# Patient Record
Sex: Female | Born: 1990 | Race: White | Hispanic: No | Marital: Single | Smoking: Never smoker
Health system: Southern US, Community
[De-identification: ages and names within clinical notes are randomized; demographics above are authoritative.]

## PROBLEM LIST (undated history)

## (undated) DIAGNOSIS — J45909 Unspecified asthma, uncomplicated: Secondary | ICD-10-CM

## (undated) DIAGNOSIS — J302 Other seasonal allergic rhinitis: Secondary | ICD-10-CM

## (undated) DIAGNOSIS — F53 Postpartum depression: Secondary | ICD-10-CM

## (undated) DIAGNOSIS — O99345 Other mental disorders complicating the puerperium: Secondary | ICD-10-CM

## (undated) HISTORY — PX: WISDOM TOOTH EXTRACTION: SHX21

---
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
## 2011-07-06 HISTORY — PX: KNEE SURGERY: SHX244

## 2014-07-05 DIAGNOSIS — O99345 Other mental disorders complicating the puerperium: Secondary | ICD-10-CM

## 2014-07-05 DIAGNOSIS — F53 Postpartum depression: Secondary | ICD-10-CM

## 2014-07-05 HISTORY — DX: Other mental disorders complicating the puerperium: O99.345

## 2014-07-05 HISTORY — DX: Postpartum depression: F53.0

## 2016-10-16 ENCOUNTER — Encounter (HOSPITAL_BASED_OUTPATIENT_CLINIC_OR_DEPARTMENT_OTHER): Payer: Self-pay | Admitting: *Deleted

## 2016-10-16 ENCOUNTER — Emergency Department (HOSPITAL_BASED_OUTPATIENT_CLINIC_OR_DEPARTMENT_OTHER): Payer: BLUE CROSS/BLUE SHIELD

## 2016-10-16 ENCOUNTER — Emergency Department (HOSPITAL_BASED_OUTPATIENT_CLINIC_OR_DEPARTMENT_OTHER)
Admission: EM | Admit: 2016-10-16 | Discharge: 2016-10-16 | Disposition: A | Discharge status: Home / Self Care | Payer: BLUE CROSS/BLUE SHIELD | Attending: Emergency Medicine | Admitting: Emergency Medicine

## 2016-10-16 DIAGNOSIS — T8339XA Other mechanical complication of intrauterine contraceptive device, initial encounter: Secondary | ICD-10-CM | POA: Insufficient documentation

## 2016-10-16 DIAGNOSIS — O9989 Other specified diseases and conditions complicating pregnancy, childbirth and the puerperium: Secondary | ICD-10-CM

## 2016-10-16 DIAGNOSIS — R102 Pelvic and perineal pain unspecified side: Secondary | ICD-10-CM

## 2016-10-16 DIAGNOSIS — J45909 Unspecified asthma, uncomplicated: Secondary | ICD-10-CM | POA: Insufficient documentation

## 2016-10-16 DIAGNOSIS — Z331 Pregnant state, incidental: Secondary | ICD-10-CM | POA: Diagnosis not present

## 2016-10-16 DIAGNOSIS — O26891 Other specified pregnancy related conditions, first trimester: Secondary | ICD-10-CM | POA: Diagnosis present

## 2016-10-16 DIAGNOSIS — Z3A09 9 weeks gestation of pregnancy: Secondary | ICD-10-CM | POA: Insufficient documentation

## 2016-10-16 DIAGNOSIS — O219 Vomiting of pregnancy, unspecified: Secondary | ICD-10-CM | POA: Diagnosis not present

## 2016-10-16 DIAGNOSIS — Z3490 Encounter for supervision of normal pregnancy, unspecified, unspecified trimester: Secondary | ICD-10-CM

## 2016-10-16 DIAGNOSIS — Y829 Unspecified medical devices associated with adverse incidents: Secondary | ICD-10-CM | POA: Diagnosis not present

## 2016-10-16 DIAGNOSIS — R109 Unspecified abdominal pain: Secondary | ICD-10-CM

## 2016-10-16 DIAGNOSIS — Z79899 Other long term (current) drug therapy: Secondary | ICD-10-CM | POA: Diagnosis not present

## 2016-10-16 DIAGNOSIS — T8331XA Breakdown (mechanical) of intrauterine contraceptive device, initial encounter: Secondary | ICD-10-CM

## 2016-10-16 HISTORY — DX: Other seasonal allergic rhinitis: J30.2

## 2016-10-16 HISTORY — DX: Other mental disorders complicating the puerperium: O99.345

## 2016-10-16 HISTORY — DX: Postpartum depression: F53.0

## 2016-10-16 HISTORY — DX: Unspecified asthma, uncomplicated: J45.909

## 2016-10-16 LAB — CBC WITH DIFFERENTIAL/PLATELET
BASOS PCT: 0 %
Basophils Absolute: 0 10*3/uL (ref 0.0–0.1)
EOS ABS: 0.1 10*3/uL (ref 0.0–0.7)
EOS PCT: 1 %
HCT: 41 % (ref 36.0–46.0)
Hemoglobin: 14 g/dL (ref 12.0–15.0)
LYMPHS ABS: 1.6 10*3/uL (ref 0.7–4.0)
Lymphocytes Relative: 18 %
MCH: 29.7 pg (ref 26.0–34.0)
MCHC: 34.1 g/dL (ref 30.0–36.0)
MCV: 86.9 fL (ref 78.0–100.0)
MONOS PCT: 5 %
Monocytes Absolute: 0.4 10*3/uL (ref 0.1–1.0)
Neutro Abs: 7 10*3/uL (ref 1.7–7.7)
Neutrophils Relative %: 76 %
Platelets: 320 10*3/uL (ref 150–400)
RBC: 4.72 MIL/uL (ref 3.87–5.11)
RDW: 13.5 % (ref 11.5–15.5)
WBC: 9.1 10*3/uL (ref 4.0–10.5)

## 2016-10-16 LAB — URINALYSIS, ROUTINE W REFLEX MICROSCOPIC
Bilirubin Urine: NEGATIVE
Glucose, UA: NEGATIVE mg/dL
Hgb urine dipstick: NEGATIVE
KETONES UR: NEGATIVE mg/dL
NITRITE: NEGATIVE
PH: 7.5 (ref 5.0–8.0)
Protein, ur: NEGATIVE mg/dL
Specific Gravity, Urine: 1.009 (ref 1.005–1.030)

## 2016-10-16 LAB — COMPREHENSIVE METABOLIC PANEL
ALBUMIN: 5 g/dL (ref 3.5–5.0)
ALT: 27 U/L (ref 14–54)
ANION GAP: 8 (ref 5–15)
AST: 22 U/L (ref 15–41)
Alkaline Phosphatase: 48 U/L (ref 38–126)
BUN: 6 mg/dL (ref 6–20)
CO2: 22 mmol/L (ref 22–32)
Calcium: 9.5 mg/dL (ref 8.9–10.3)
Chloride: 104 mmol/L (ref 101–111)
Creatinine, Ser: 0.54 mg/dL (ref 0.44–1.00)
GFR calc non Af Amer: >60 mL/min (ref >60)
Glucose, Bld: 101 mg/dL — ABNORMAL HIGH (ref 65–99)
POTASSIUM: 3.5 mmol/L (ref 3.5–5.1)
SODIUM: 134 mmol/L — AB (ref 135–145)
Total Bilirubin: 0.5 mg/dL (ref 0.3–1.2)
Total Protein: 8.2 g/dL — ABNORMAL HIGH (ref 6.5–8.1)

## 2016-10-16 LAB — PREGNANCY, URINE: Preg Test, Ur: POSITIVE — AB

## 2016-10-16 LAB — URINALYSIS, MICROSCOPIC (REFLEX): RBC / HPF: NONE SEEN RBC/hpf (ref 0–5)

## 2016-10-16 LAB — WET PREP, GENITAL
CLUE CELLS WET PREP: NONE SEEN
Sperm: NONE SEEN
TRICH WET PREP: NONE SEEN
YEAST WET PREP: NONE SEEN

## 2016-10-16 LAB — HCG, QUANTITATIVE, PREGNANCY: HCG, BETA CHAIN, QUANT, S: 25346 m[IU]/mL — AB (ref <5)

## 2016-10-16 LAB — LIPASE, BLOOD: Lipase: 21 U/L (ref 11–51)

## 2016-10-16 MED ORDER — ONDANSETRON 4 MG PO TBDP: 4.0000 mg | ORAL_TABLET | Freq: Three times a day (TID) | ORAL | 0 refills | Status: AC | PRN

## 2016-10-16 MED ORDER — PRENATAL COMPLETE 14-0.4 MG PO TABS: 1.0000 | ORAL_TABLET | Freq: Every day | ORAL | 2 refills | Status: AC

## 2016-10-16 MED ORDER — SODIUM CHLORIDE 0.9 % IV BOLUS (SEPSIS)
1000.0000 mL | Freq: Once | INTRAVENOUS | Status: AC
  Administered 2016-10-16: 1000 mL via INTRAVENOUS

## 2016-10-16 MED ORDER — ONDANSETRON HCL 4 MG/2ML IJ SOLN
4.0000 mg | Freq: Once | INTRAMUSCULAR | Status: AC
Start: 2016-10-16 — End: 2016-10-16
  Administered 2016-10-16: 4 mg via INTRAVENOUS
  Filled 2016-10-16: qty 2

## 2016-10-16 NOTE — ED Notes (Signed)
Pt ambulatory without assistance, in NAD.

## 2016-10-16 NOTE — ED Notes (Signed)
Pt given d/c instructions as per chart. Rx x 2. Verbalizes understanding. No questions. 

## 2016-10-16 NOTE — ED Triage Notes (Signed)
Pt reports abd pain for >1wk. Reports this has been an intermittent issue since getting Mirena 2 yrs ago -- reports scheduled appt with OB this coming Tuesday. Also reports n/v. Denies fever, diarrhea, genitourinary symptoms.

## 2016-10-16 NOTE — ED Provider Notes (Signed)
[facility]   CSN: [id number] Arrival date & time: 10/16/16  7829     History   Chief Complaint Chief Complaint  Patient presents with  . Abdominal Pain    HPI Deborah Best is a 26 y.o. female with a PMHx of asthma, who presents to the ED with complaints of lower abdominal pain x1.5 wks, and nausea/vomiting x2 days. Patient states that since having her Mirena put in almost 2 years ago she's had monthly lower abdominal cramps and nausea and vomiting, however this most recent episode has persisted therefore she came to the emergency room for further evaluation. She describes the pain as 8/10 constant crampy sharp lower abdominal pain that is nonradiating, worse with movement or palpation, and unrelieved by Tylenol, ibuprofen, heat, and Pepto-Bismol. She has had 3-4 episodes per day of nonbloody nonbilious emesis. She has very irregular menstrual cycles, her LMP was sometime in the first week of February. Has an OB appt on Tuesday (3 days from now).   She denies fevers, chills, CP, SOB, diarrhea/constipation, obstipation, melena, hematochezia, hematemesis, hematuria, dysuria, vaginal bleeding/discharge, myalgias, arthralgias, numbness, tingling, focal weakness, or any other complaints at this time. Denies recent travel, sick contacts, suspicious food intake, EtOH use, NSAID use, or prior abd surgeries.    The history is provided by the patient and medical records. No language interpreter was used.  Abdominal Pain   This is a recurrent problem. The current episode started more than 1 week ago. The problem occurs constantly. The problem has not changed since onset.Associated with: mirena insertion 57yrs ago. The pain is located in the suprapubic region, RLQ and LLQ. The quality of the pain is cramping and sharp. The pain is at a severity of 8/10. The pain is moderate. Associated symptoms include nausea and vomiting. Pertinent negatives include fever, diarrhea, flatus,  hematochezia, melena, constipation, dysuria, hematuria, arthralgias and myalgias. The symptoms are aggravated by activity and palpation. Nothing relieves the symptoms.    Past Medical History:  Diagnosis Date  . Asthma   . Post-partum depression 2016  . Seasonal allergies     There are no active problems to display for this patient.   Past Surgical History:  Procedure Laterality Date  . EYE MUSCLE SURGERY Bilateral 1994  . KNEE SURGERY Right 2013  . WISDOM TOOTH EXTRACTION      OB History    No data available       Home Medications    Prior to Admission medications   Medication Sig Start Date End Date Taking? Authorizing Provider  albuterol (PROVENTIL HFA;VENTOLIN HFA) 108 (90 Base) MCG/ACT inhaler Inhale 1-2 puffs into the lungs every 6 (six) hours as needed for wheezing or shortness of breath.   Yes Historical Provider, MD  escitalopram (LEXAPRO) 20 MG tablet Take 20 mg by mouth daily.   Yes Historical Provider, MD    Family History No family history on file.  Social History Social History  Substance Use Topics  . Smoking status: Never Smoker  . Smokeless tobacco: Never Used  . Alcohol use Yes     Comment: rarely     Allergies   Atropine   Review of Systems Review of Systems  Constitutional: Negative for chills and fever.  Respiratory: Negative for shortness of breath.   Cardiovascular: Negative for chest pain.  Gastrointestinal: Positive for abdominal pain, nausea and vomiting. Negative for blood in stool, constipation, diarrhea, flatus, hematochezia and melena.  Genitourinary: Negative for dysuria, hematuria, vaginal bleeding and vaginal  discharge.  Musculoskeletal: Negative for arthralgias and myalgias.  Skin: Negative for color change.  Allergic/Immunologic: Negative for immunocompromised state.  Neurological: Negative for weakness and numbness.  Psychiatric/Behavioral: Negative for confusion.   10 Systems reviewed and are negative for acute  change except as noted in the HPI.   Physical Exam Updated Vital Signs BP 119/72 (BP Location: Left Arm)   Pulse 76   Temp 98.7 F (37.1 C) (Oral)   Resp 18   Ht  (1.651 m)   Wt 95.3 kg   LMP 08/08/2016 (Approximate)   SpO2 98%   BMI 34.95 kg/m   Physical Exam  Constitutional: She is oriented to person, place, and time. Vital signs are normal. She appears well-developed and well-nourished.  Non-toxic appearance. No distress.  Afebrile, nontoxic, NAD  HENT:  Head: Normocephalic and atraumatic.  Mouth/Throat: Oropharynx is clear and moist and mucous membranes are normal.  Eyes: Conjunctivae and EOM are normal. Right eye exhibits no discharge. Left eye exhibits no discharge.  Neck: Normal range of motion. Neck supple.  Cardiovascular: Normal rate, regular rhythm, normal heart sounds and intact distal pulses.  Exam reveals no gallop and no friction rub.   No murmur heard. Pulmonary/Chest: Effort normal and breath sounds normal. No respiratory distress. She has no decreased breath sounds. She has no wheezes. She has no rhonchi. She has no rales.  Abdominal: Soft. Normal appearance and bowel sounds are normal. She exhibits no distension. There is tenderness in the right lower quadrant, suprapubic area and left lower quadrant. There is no rigidity, no rebound, no guarding, no CVA tenderness, no tenderness at McBurney's point and negative Murphy's sign.  Soft, obese but nondistended, +BS throughout, with mild lower abd TTP diffusely along pelvic brim, no r/g/r, neg murphy's, neg mcburney's, no CVA TTP   Genitourinary: Uterus normal. Pelvic exam was performed with patient supine. There is no rash, tenderness or lesion on the right labia. There is no rash, tenderness or lesion on the left labia. Cervix exhibits discharge (scant clear, likely physiologic). Cervix exhibits no motion tenderness and no friability. Right adnexum displays no mass, no tenderness and no fullness. Left adnexum  displays no mass, no tenderness and no fullness. No erythema, tenderness or bleeding in the vagina. Vaginal discharge (scant clear, likely physiologic) found.  Genitourinary Comments: Chaperone present for exam. No rashes, lesions, or tenderness to external genitalia. No erythema, injury, or tenderness to vaginal mucosa. Scant clearish white vaginal discharge without bleeding within vaginal vault, seems physiologic. No adnexal masses, tenderness, or fullness. No CMT or cervical friability, although cervix appears slightly irritated; no IUD present or visible coming from cervix. Slight clearish white physiologic discharge from cervical os. Cervical os is closed. Uterus non-deviated, mobile, nonTTP, and without enlargement.    Musculoskeletal: Normal range of motion.  Neurological: She is alert and oriented to person, place, and time. She has normal strength. No sensory deficit.  Skin: Skin is warm, dry and intact. No rash noted.  Psychiatric: She has a normal mood and affect.  Nursing note and vitals reviewed.    ED Treatments / Results  Labs (all labs ordered are listed, but only abnormal results are displayed) Labs Reviewed  WET PREP, GENITAL - Abnormal; Notable for the following:       Result Value   WBC, Wet Prep HPF POC MANY (*)    All other components within normal limits  URINALYSIS, ROUTINE W REFLEX MICROSCOPIC - Abnormal; Notable for the following:    Leukocytes, UA  TRACE (*)    All other components within normal limits  PREGNANCY, URINE - Abnormal; Notable for the following:    Preg Test, Ur POSITIVE (*)    All other components within normal limits  URINALYSIS, MICROSCOPIC (REFLEX) - Abnormal; Notable for the following:    Bacteria, UA FEW (*)    Squamous Epithelial / LPF 0-5 (*)    All other components within normal limits  COMPREHENSIVE METABOLIC PANEL - Abnormal; Notable for the following:    Sodium 134 (*)    Glucose, Bld 101 (*)    Total Protein 8.2 (*)    All other  components within normal limits  HCG, QUANTITATIVE, PREGNANCY - Abnormal; Notable for the following:    hCG, Beta Chain, Quant, S 25,346 (*)    All other components within normal limits  URINE CULTURE  CBC WITH DIFFERENTIAL/PLATELET  LIPASE, BLOOD  RPR  HIV ANTIBODY (ROUTINE TESTING)  GC/CHLAMYDIA PROBE AMP ([place]) NOT AT [facility]    EKG  EKG Interpretation None       Radiology US Ob Comp Less 14 Wks  Result Date: 10/16/2016 CLINICAL DATA:  26 year old female 1 week of diffuse pelvic and left lower quadrant pain. Positive pregnancy test today. Thought to have had an IUD in place. Denies bleeding. Estimated gestational age by LMP 9 weeks 6 days . EXAM: OBSTETRIC <14 WK [place] AND TRANSVAGINAL OB US DOPPLER ULTRASOUND OF OVARIES TECHNIQUE: Both transabdominal and transvaginal ultrasound examinations were performed for complete evaluation of the gestation as well as the maternal uterus, adnexal regions, and pelvic cul-de-sac. Transvaginal technique was performed to assess early pregnancy. Color and duplex Doppler ultrasound was utilized to evaluate blood flow to the ovaries. COMPARISON:  None. FINDINGS: Intrauterine gestational sac: Single Yolk sac:  Visible Embryo:  Visible Cardiac Activity: Not detected Heart Rate: Not applicable bpm MSD: 11.9  mm   6 w   0  d Subchorionic hemorrhage: Small volume subchorionic hemorrhage, image 94. Maternal uterus/adnexae: Normal right ovary measuring 2.6 x 1.3 x 1.4 cm. Larger left ovary which probably contains the corpus luteum measures 2.9 x 2.4 x 2.4 cm. No IUD identified in the uterus. Pulsed Doppler evaluation of both ovaries demonstrates normal appearing low-resistance arterial and venous waveforms. Other findings: No pelvic free fluid. IMPRESSION: 1. Early IUP with yolk sac and tiny fetal pole. Cardiac activity not yet detected. 2. Small volume subchorionic hemorrhage.  No pelvic free fluid. 3. Normal ovaries, corpus luteum probably on the left. No  evidence of ovarian torsion. 4. Recommend follow-up quantitative B-HCG levels and follow-up US in 14 days to assess viability. This recommendation follows SRU consensus guidelines: Diagnostic Criteria for Nonviable Pregnancy Early in the First Trimester. Malva Limes Med 2013; 161:0960-45. Electronically Signed   By: Odessa Fleming M.D.   On: 10/16/2016 13:16   US Ob Transvaginal  Result Date: 10/16/2016 CLINICAL DATA:  26 year old female 1 week of diffuse pelvic and left lower quadrant pain. Positive pregnancy test today. Thought to have had an IUD in place. Denies bleeding. Estimated gestational age by LMP 9 weeks 6 days . EXAM: OBSTETRIC <14 WK [place] AND TRANSVAGINAL OB US DOPPLER ULTRASOUND OF OVARIES TECHNIQUE: Both transabdominal and transvaginal ultrasound examinations were performed for complete evaluation of the gestation as well as the maternal uterus, adnexal regions, and pelvic cul-de-sac. Transvaginal technique was performed to assess early pregnancy. Color and duplex Doppler ultrasound was utilized to evaluate blood flow to the ovaries. COMPARISON:  None. FINDINGS: Intrauterine gestational sac: Single  Yolk sac:  Visible Embryo:  Visible Cardiac Activity: Not detected Heart Rate: Not applicable bpm MSD: 11.9  mm   6 w   0  d Subchorionic hemorrhage: Small volume subchorionic hemorrhage, image 94. Maternal uterus/adnexae: Normal right ovary measuring 2.6 x 1.3 x 1.4 cm. Larger left ovary which probably contains the corpus luteum measures 2.9 x 2.4 x 2.4 cm. No IUD identified in the uterus. Pulsed Doppler evaluation of both ovaries demonstrates normal appearing low-resistance arterial and venous waveforms. Other findings: No pelvic free fluid. IMPRESSION: 1. Early IUP with yolk sac and tiny fetal pole. Cardiac activity not yet detected. 2. Small volume subchorionic hemorrhage.  No pelvic free fluid. 3. Normal ovaries, corpus luteum probably on the left. No evidence of ovarian torsion. 4. Recommend follow-up  quantitative B-HCG levels and follow-up US in 14 days to assess viability. This recommendation follows SRU consensus guidelines: Diagnostic Criteria for Nonviable Pregnancy Early in the First Trimester. Malva Limes Med 2013; 161:0960-45. Electronically Signed   By: Odessa Fleming M.D.   On: 10/16/2016 13:16   [place] Art/ven Flow Abd Pelv Doppler  Result Date: 10/16/2016 CLINICAL DATA:  26 year old female 1 week of diffuse pelvic and left lower quadrant pain. Positive pregnancy test today. Thought to have had an IUD in place. Denies bleeding. Estimated gestational age by LMP 9 weeks 6 days . EXAM: OBSTETRIC <14 WK [place] AND TRANSVAGINAL OB US DOPPLER ULTRASOUND OF OVARIES TECHNIQUE: Both transabdominal and transvaginal ultrasound examinations were performed for complete evaluation of the gestation as well as the maternal uterus, adnexal regions, and pelvic cul-de-sac. Transvaginal technique was performed to assess early pregnancy. Color and duplex Doppler ultrasound was utilized to evaluate blood flow to the ovaries. COMPARISON:  None. FINDINGS: Intrauterine gestational sac: Single Yolk sac:  Visible Embryo:  Visible Cardiac Activity: Not detected Heart Rate: Not applicable bpm MSD: 11.9  mm   6 w   0  d Subchorionic hemorrhage: Small volume subchorionic hemorrhage, image 94. Maternal uterus/adnexae: Normal right ovary measuring 2.6 x 1.3 x 1.4 cm. Larger left ovary which probably contains the corpus luteum measures 2.9 x 2.4 x 2.4 cm. No IUD identified in the uterus. Pulsed Doppler evaluation of both ovaries demonstrates normal appearing low-resistance arterial and venous waveforms. Other findings: No pelvic free fluid. IMPRESSION: 1. Early IUP with yolk sac and tiny fetal pole. Cardiac activity not yet detected. 2. Small volume subchorionic hemorrhage.  No pelvic free fluid. 3. Normal ovaries, corpus luteum probably on the left. No evidence of ovarian torsion. 4. Recommend follow-up quantitative B-HCG levels and follow-up US in  14 days to assess viability. This recommendation follows SRU consensus guidelines: Diagnostic Criteria for Nonviable Pregnancy Early in the First Trimester. Malva Limes Med 2013; 409:8119-14. Electronically Signed   By: Odessa Fleming M.D.   On: 10/16/2016 13:16    Procedures Procedures (including critical care time)  Medications Ordered in ED Medications  sodium chloride 0.9 % bolus 1,000 mL (0 mLs Intravenous Stopped 10/16/16 1045)  ondansetron (ZOFRAN) injection 4 mg (4 mg Intravenous Given 10/16/16 1008)     Initial Impression / Assessment and Plan / ED Course  I have reviewed the triage vital signs and the nursing notes.  Pertinent labs & imaging results that were available during my care of the patient were reviewed by me and considered in my medical decision making (see chart for details).     26 y.o. female here with lower abd cramps/pain and n/v that she's had monthly  since getting mirena inserted 56yrs ago; this time symptoms started 1.5wks ago and have been more persistent. On exam, mild lower abd TTP diffusely along pelvic brim, nonperitoneal. Upreg+ therefore will proceed with pelvic exam. U/A with trace leuks, 0-5 squamous and WBC, few bacteria; seems contaminated, will send for culture but doesn't appear that she would need empiric tx of UTI. Will proceed with getting labs and pelvic exam, and then likely get U/S to eval for location of pregnancy/etc. Will give fluids and zofran. Will reassess shortly.   11:28 AM CBC w/diff WNL. CMP WNL. Lipase WNL. QuantHCG 16,109. Pelvic reveals scant clearish white vaginal discharge likely physiologic, no mirena/IUD strings seen in cervix, os closed, cervix appears slightly irritated but not friable, no CMT, no adnexal masses/fullness. Will proceed with U/S to eval location of pregnancy, see if we can find mirena, and r/o ectopic vs TOA vs torsion, etc. Will reassess shortly.   3:15 PM Pt feeling improved, tolerating PO well. Wet prep with many WBCs  likely from the physiologic discharge; otherwise neg. Doubt need for empiric tx of GC/CT, will let testing come back and guide therapy. U/S confirms IUP at ~[redacted]w[redacted]d, too early for cardiac activity, fetal pole and yolk sac seen; small subchorionic hemorrhage noted. Will need to f/up for serial betaHCG to ensure viability of this early pregnancy. No IUD identified. It's likely that it fell out. Will start on prenatals, advised f/up with OBGYN for ongoing prenatal care. Tylenol as needed for pain, stay hydrated. Rx zofran given (advised potential risks in early pregnancy, pt used this in the past during pregnancy and agrees with risks, would like this rx). Discussed eating plan for morning sickness. Discussed abstinence until STD results come back, f/up with health dept for future STD concerns. Safe sex encouraged, and discussed having partners tested and treated before re-engaging in intercourse. F/up with OBGYN next week for ongoing care and recheck of symptoms. I explained the diagnosis and have given explicit precautions to return to the ER including for any other new or worsening symptoms. The patient understands and accepts the medical plan as it's been dictated and I have answered their questions. Discharge instructions concerning home care and prescriptions have been given. The patient is STABLE and is discharged to home in good condition.    Final Clinical Impressions(s) / ED Diagnoses   Final diagnoses:  Pelvic pain  Abdominal pain during pregnancy in first trimester  Pregnancy, incidental  Nausea/vomiting in pregnancy  Intrauterine device (IUD) contraceptive failure resulting in pregnancy  Intrauterine pregnancy, incidental    New Prescriptions New Prescriptions   ONDANSETRON (ZOFRAN-ODT) 4 MG DISINTEGRATING TABLET    Take 1 tablet (4 mg total) by mouth every 8 (eight) hours as needed for nausea or vomiting.   PRENATAL VIT-FE FUMARATE-FA (PRENATAL COMPLETE) 14-0.4 MG TABS    Take 1 tablet by  mouth daily after breakfast.     [address], PA-C 10/16/16 1518    Vanetta Mulders, MD 10/17/16 [phone]

## 2016-10-16 NOTE — Discharge Instructions (Signed)
Your pregnancy test was positive today, and it appears you are pregnant! Your ultrasound confirmed that the fetus is in the right place, however it's too early to see any heart activity yet. You will need to follow up with your OBGYN for ongoing prenatal care and to ensure that the pregnancy progresses and fetus develops cardiac activity when it's far enough along. It seems your IUD has fallen out, discuss with your OBGYN regarding what needs to be done at this time with the missing IUD. Start taking prenatal vitamins. Use tylenol as needed for pain, but do NOT take any NSAIDs like ibuprofen/aleve/etc. Stay well hydrated. Use zofran as directed as needed for nausea/vomiting. See the list of foods to help with pregnancy related nausea. You have been tested for gonorrhea, chlamydia, HIV, and Syphilis, and the hospital will call you if the lab is positive. DO NOT ENGAGE IN SEXUAL ACTIVITY UNTIL YOU FIND OUT ABOUT YOUR RESULTS AND HAVE PARTNERS TESTED AND TREATED. ALL PARTNERS MUST BE TESTED AND TREATED FOR STD'S. ALWAYS USE CONDOMS WHEN ENGAGING IN INTERCOURSE. Follow up with [facility] Department STD clinic for future STD concerns or screenings.  Follow up with your regular OBGYN in 1 week for recheck of symptoms and ongoing prenatal care. Return to the [facility] hospital emergency department (called the [facility]) for changes or worsening symptoms.

## 2016-10-17 LAB — URINE CULTURE: Culture: <10000 — AB

## 2016-10-17 LAB — RPR: RPR: NONREACTIVE

## 2016-10-17 LAB — HIV ANTIBODY (ROUTINE TESTING W REFLEX): HIV SCREEN 4TH GENERATION: NONREACTIVE

## 2016-10-18 LAB — GC/CHLAMYDIA PROBE AMP (~~LOC~~) NOT AT ARMC
CHLAMYDIA, DNA PROBE: NEGATIVE
Neisseria Gonorrhea: NEGATIVE

## 2018-07-16 IMAGING — US US OB COMP LESS 14 WK
1 series · 13 of 28 positions shown · non-contrast
Comparison: None.

CLINICAL DATA: 25-year-old female 1 week of diffuse pelvic and left
lower quadrant pain. Positive pregnancy test today. Thought to have
had an IUD in place. Denies bleeding. Estimated gestational age by
LMP 9 weeks 6 days .



[Series 1: us ob comp less 14 wk · 0.18mm/px · 13 of 94 slices shown]
[im 4/94]
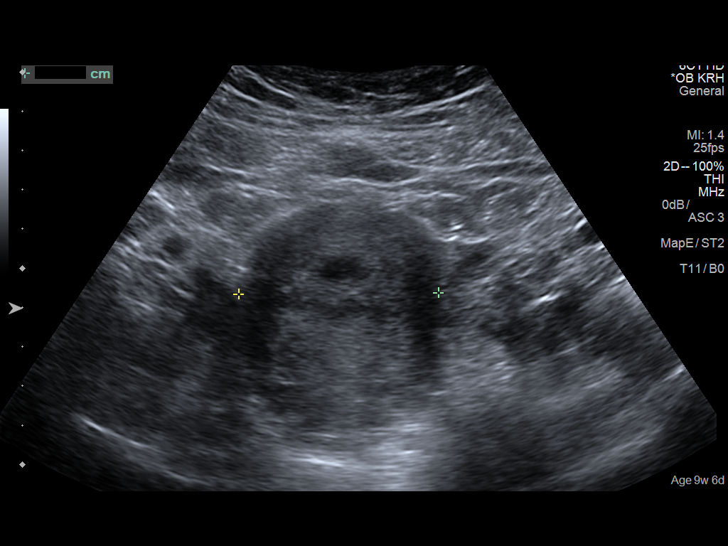
[im 11/94]
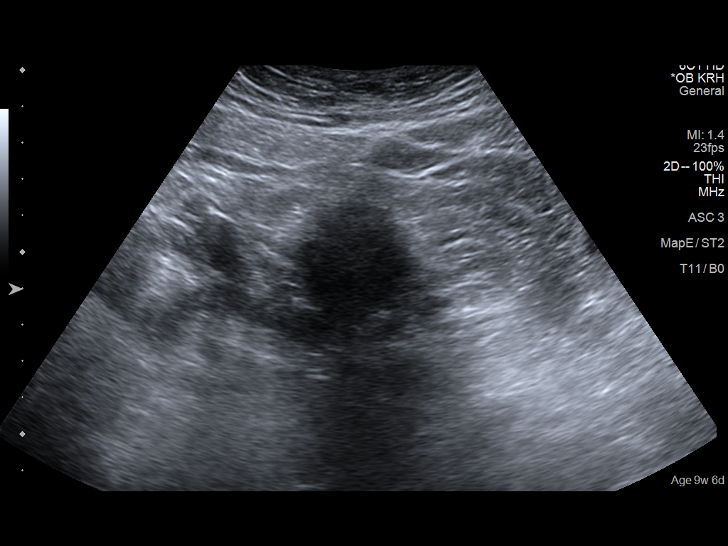
[im 18/94]
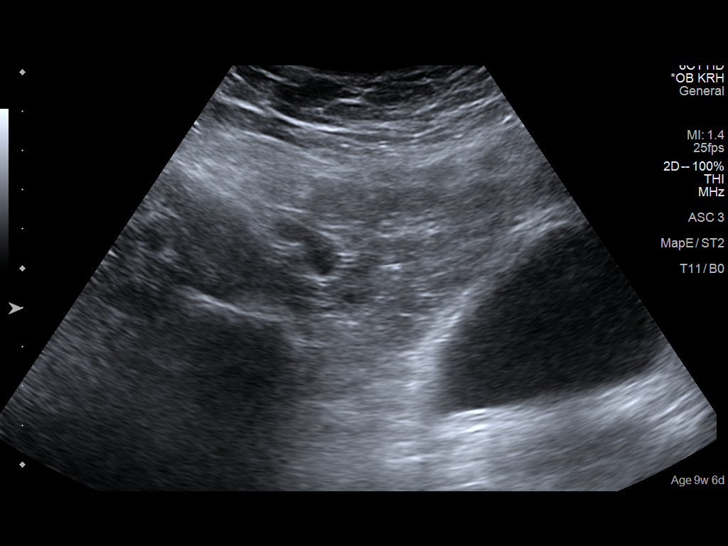
[im 25/94]
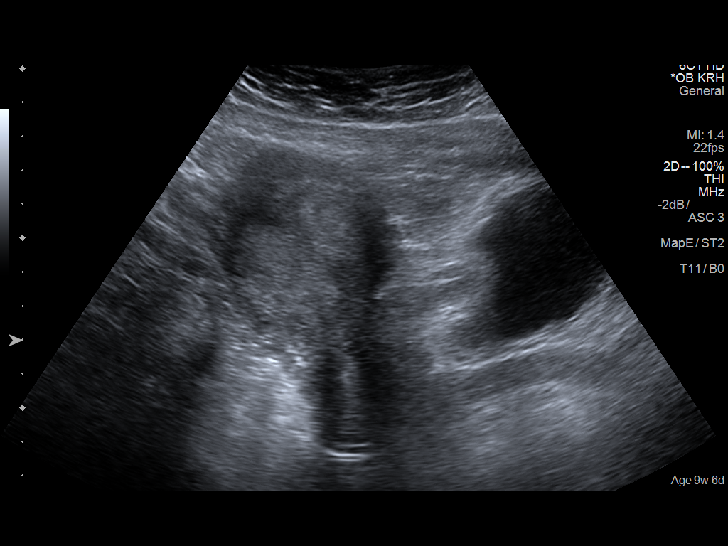
[im 32/94]
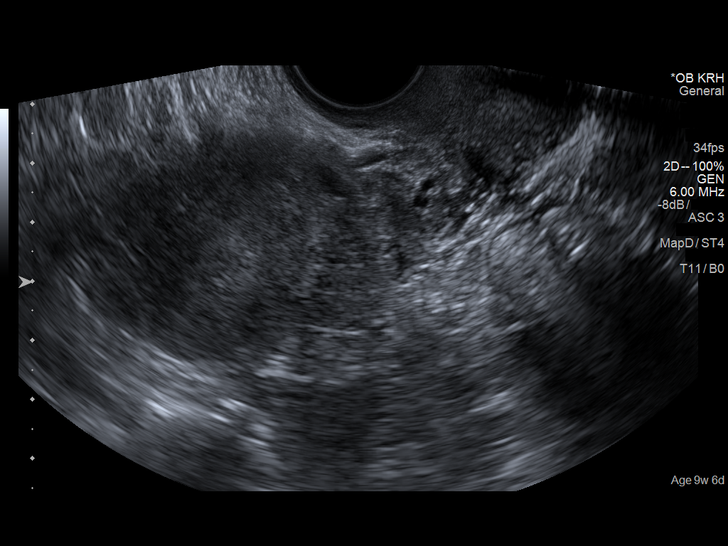
[im 38/94]
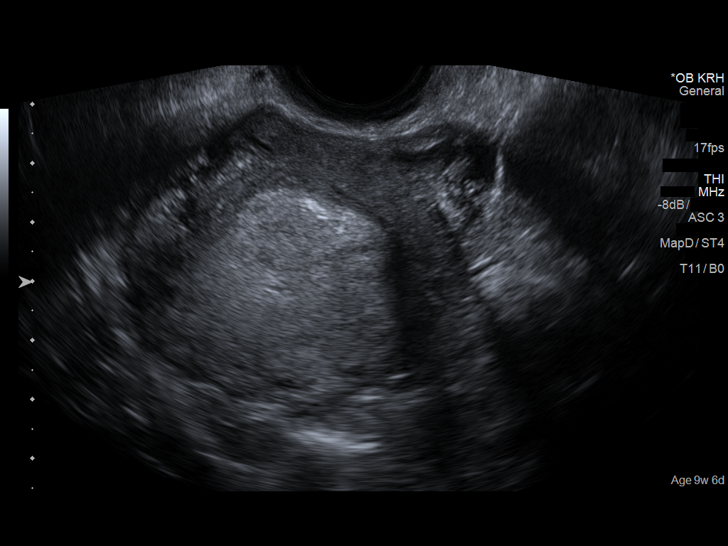
[im 49/94]
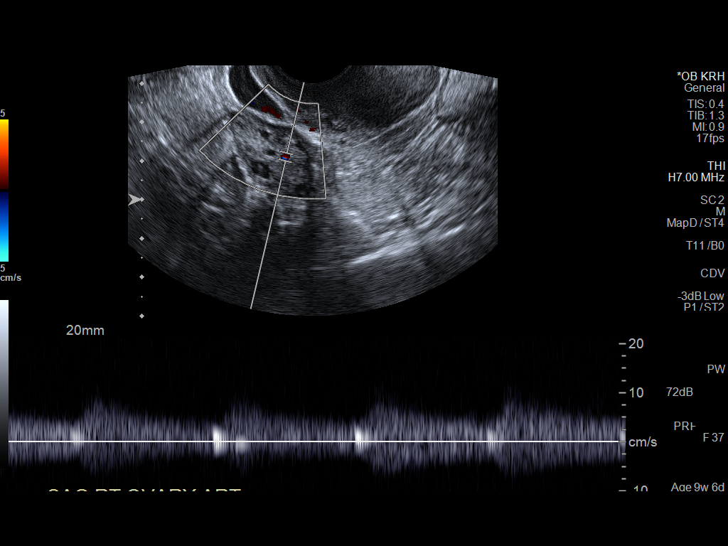
[im 56/94]
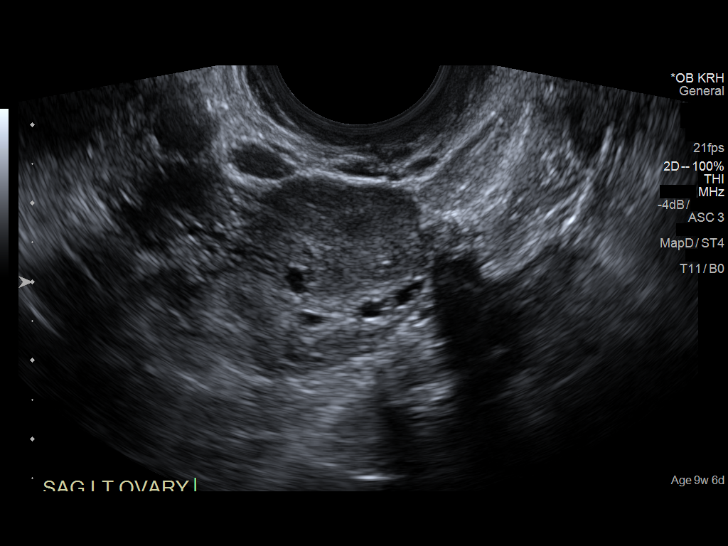
[im 63/94]
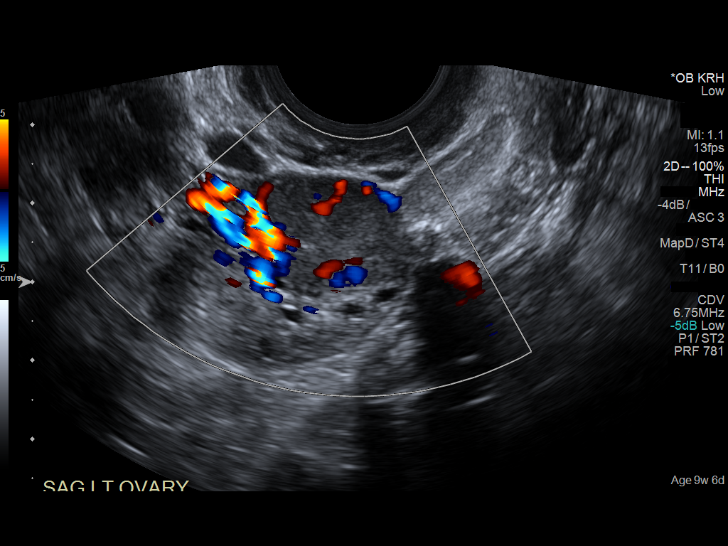
[im 69/94]
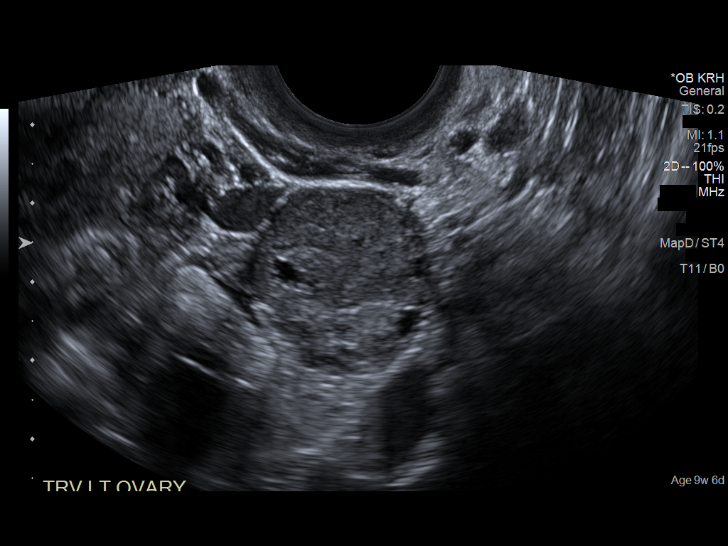
[im 76/94]
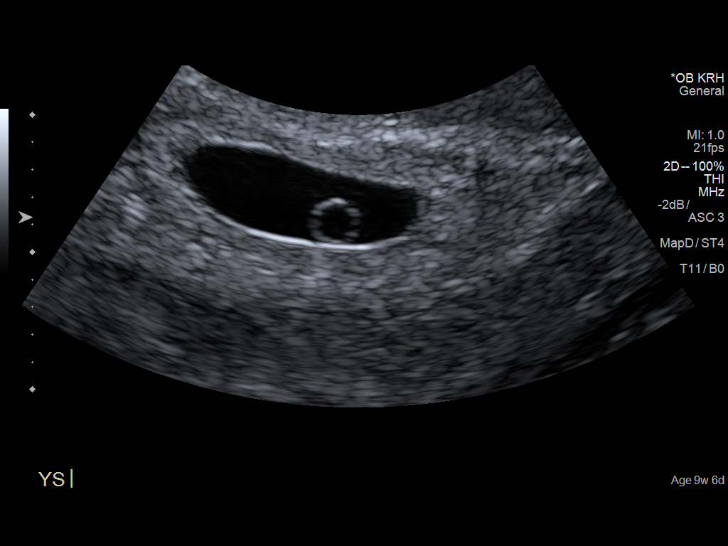
[im 83/94]
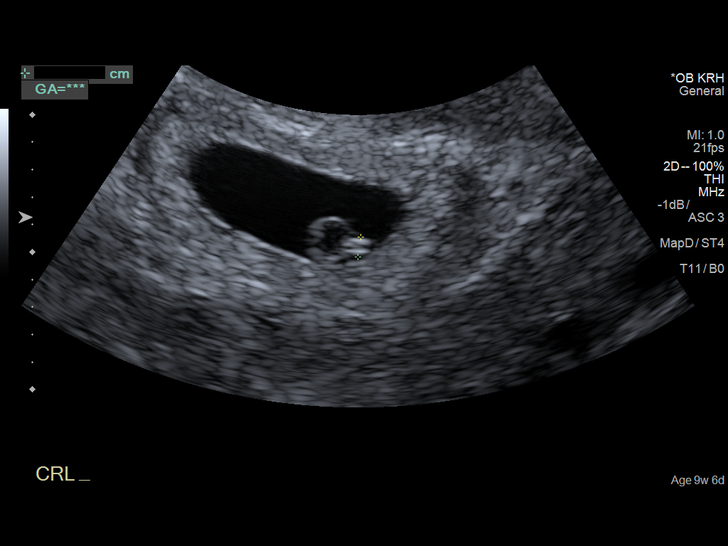
[im 90/94]
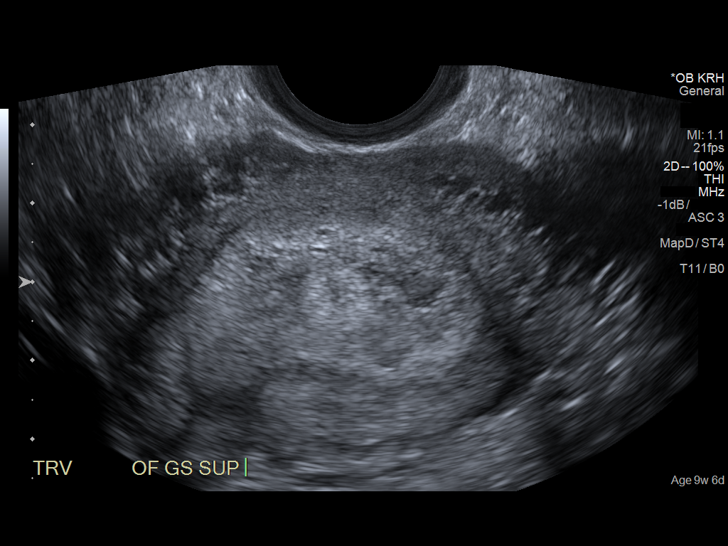

[13 of 28 positions shown; findings below may reference images not displayed]

FINDINGS: Intrauterine gestational sac: Single

Yolk sac:  Visible

Embryo:  Visible

Cardiac Activity: Not detected

Heart Rate: Not applicable bpm

MSD: 11.9  mm   6 w   0  d

Subchorionic hemorrhage: Small volume subchorionic hemorrhage, image
94.

Maternal uterus/adnexae: Normal right ovary measuring 2.6 x 1.3 x
1.4 cm. Larger left ovary which probably contains the corpus luteum
measures 2.9 x 2.4 x 2.4 cm.

No IUD identified in the uterus.

Pulsed Doppler evaluation of both ovaries demonstrates normal
appearing low-resistance arterial and venous waveforms.

Other findings: No pelvic free fluid.
IMPRESSION: 1. Early IUP with yolk sac and tiny fetal pole. Cardiac activity not
yet detected.
2. Small volume subchorionic hemorrhage.  No pelvic free fluid.
3. Normal ovaries, corpus luteum probably on the left. No evidence
of ovarian torsion.
4. Recommend follow-up quantitative B-HCG levels and follow-up US in
14 days to assess viability. This recommendation follows SRU
consensus guidelines: Diagnostic Criteria for Nonviable Pregnancy
Early in the First Trimester. N Engl J Med 3616; [DATE].
